# Patient Record
Sex: Male | Born: 1955 | Race: White | Hispanic: No | Marital: Married | Smoking: Never smoker
Health system: Southern US, Community
[De-identification: ages and names within clinical notes are randomized; demographics above are authoritative.]

## PROBLEM LIST (undated history)

## (undated) DIAGNOSIS — K219 Gastro-esophageal reflux disease without esophagitis: Secondary | ICD-10-CM

## (undated) HISTORY — DX: Gastro-esophageal reflux disease without esophagitis: K21.9

---

## 1998-11-10 HISTORY — PX: BACK SURGERY: SHX140

## 1999-02-18 ENCOUNTER — Encounter: Admission: RE | Admit: 1999-02-18 | Discharge: 1999-04-15 | Payer: Self-pay | Admitting: Family Medicine

## 1999-06-05 ENCOUNTER — Encounter: Payer: Self-pay | Admitting: Neurosurgery

## 1999-06-05 ENCOUNTER — Ambulatory Visit (HOSPITAL_COMMUNITY): Admission: RE | Admit: 1999-06-05 | Discharge: 1999-06-05 | Payer: Self-pay | Admitting: Neurosurgery

## 2005-10-20 ENCOUNTER — Encounter: Admission: RE | Admit: 2005-10-20 | Discharge: 2005-10-20 | Payer: Self-pay | Admitting: General Surgery

## 2005-10-23 ENCOUNTER — Ambulatory Visit (HOSPITAL_BASED_OUTPATIENT_CLINIC_OR_DEPARTMENT_OTHER): Admission: RE | Admit: 2005-10-23 | Discharge: 2005-10-23 | Payer: Self-pay | Admitting: General Surgery

## 2005-10-23 ENCOUNTER — Ambulatory Visit (HOSPITAL_COMMUNITY): Admission: RE | Admit: 2005-10-23 | Discharge: 2005-10-23 | Payer: Self-pay | Admitting: General Surgery

## 2009-02-26 ENCOUNTER — Ambulatory Visit: Payer: Self-pay | Admitting: Internal Medicine

## 2009-03-13 ENCOUNTER — Ambulatory Visit: Payer: Self-pay | Admitting: Internal Medicine

## 2009-03-15 ENCOUNTER — Telehealth (INDEPENDENT_AMBULATORY_CARE_PROVIDER_SITE_OTHER): Payer: Self-pay | Admitting: *Deleted

## 2009-09-07 ENCOUNTER — Telehealth: Payer: Self-pay | Admitting: Internal Medicine

## 2009-09-18 ENCOUNTER — Telehealth (INDEPENDENT_AMBULATORY_CARE_PROVIDER_SITE_OTHER): Payer: Self-pay | Admitting: *Deleted

## 2009-09-18 DIAGNOSIS — Q438 Other specified congenital malformations of intestine: Secondary | ICD-10-CM

## 2009-09-21 ENCOUNTER — Ambulatory Visit (HOSPITAL_COMMUNITY): Admission: RE | Admit: 2009-09-21 | Discharge: 2009-09-21 | Payer: Self-pay | Admitting: Internal Medicine

## 2009-09-26 ENCOUNTER — Telehealth: Payer: Self-pay | Admitting: Internal Medicine

## 2011-03-28 NOTE — Op Note (Signed)
NAMELACHARLES, ALTSCHULER            ACCOUNT NO.:  [id number]   MEDICAL RECORD NO.:  [id number]          PATIENT TYPE:  AMB   LOCATION:  DSC                          FACILITY:  [facility]   PHYSICIAN:  Leonie Man, M.D.   DATE OF BIRTH:  09/11/1956   DATE OF PROCEDURE:  10/23/2005  DATE OF DISCHARGE:  10/23/2005                                 OPERATIVE REPORT   PREOPERATIVE DIAGNOSIS:  Incarcerated ventral hernia.   POSTOPERATIVE DIAGNOSIS:  Incarcerated ventral hernia.   PROCEDURE:  Repair of incarcerated ventral hernia.   SURGEON:  Leonie Man, M.D.   ASSISTANT:  OR tech.   ANESTHESIA:  General.   FINDINGS:  There was a large hernia sac removed from the site which was  discarded.   ESTIMATED BLOOD LOSS:  Minimal.   COMPLICATIONS:  There no complications.   INDICATIONS FOR PROCEDURE:  The patient is a 55 year old man with a large  orange-size epigastric hernia which sometimes he says swells to the size of  a grapefruit but never completely goes away. This has been causing some mild  to moderate discomfort.  The patient comes to the operating room now for  repair of this hernia which has moderately incarcerated but has not been  causing any bowel obstructive symptoms.   DESCRIPTION OF PROCEDURE:  Following induction of satisfactory general  anesthesia, the patient is positioned supinely and the abdomen is prepped  and draped to be included in a sterile operative field. Midline incision is  carried down over the mass, deepened through skin, subcutaneous tissue with  dissection carried down around the entire mass and dissecting away from the  surrounding soft tissues, carrying the dissection down to the fascia of the  abdominal wall. The fascia was scored so as to release the mass and the mass  which comprised primarily the falciform ligament was then serially divided  between clamps and secured with ties of 2-0 silk.  The resulting defect then  was cleared of all adhesions  from within the abdomen and then this was  primarily closed with interrupted #1 Novofil sutures.  We then placed the  overlay patch over the defect with a polypropylene mesh and then this was  sutured down on all sides with interrupted 2-0 Novofil sutures.  The repair  was noted to be intact. Sponge and instrument counts were verified.  Subcutaneous tissues were closed with a running 2-0 Vicryl suture.  The skin  was  closed with a running 4-0 Monocryl suture and then reinforced with Steri-  Strips.  Sterile dressings applied.  The anesthetic was reversed. The  patient removed from the operating room to the recovery room in stable  condition.  He tolerated the procedure well.      Leonie Man, M.D.  Electronically Signed     PB/MEDQ  D:  10/23/2005  T:  10/26/2005  Job:  [id number]   cc:   Thelma Barge P. [place]esto Charon, M.D.  Fax: [phone]

## 2011-06-02 ENCOUNTER — Emergency Department (INDEPENDENT_AMBULATORY_CARE_PROVIDER_SITE_OTHER): Payer: Managed Care, Other (non HMO)

## 2011-06-02 ENCOUNTER — Emergency Department (HOSPITAL_BASED_OUTPATIENT_CLINIC_OR_DEPARTMENT_OTHER)
Admission: EM | Admit: 2011-06-02 | Discharge: 2011-06-02 | Disposition: A | Discharge status: Home / Self Care | Payer: Managed Care, Other (non HMO) | Attending: Emergency Medicine | Admitting: Emergency Medicine

## 2011-06-02 ENCOUNTER — Encounter: Payer: Self-pay | Admitting: *Deleted

## 2011-06-02 DIAGNOSIS — S83419A Sprain of medial collateral ligament of unspecified knee, initial encounter: Secondary | ICD-10-CM | POA: Insufficient documentation

## 2011-06-02 DIAGNOSIS — F17200 Nicotine dependence, unspecified, uncomplicated: Secondary | ICD-10-CM | POA: Insufficient documentation

## 2011-06-02 DIAGNOSIS — X58XXXA Exposure to other specified factors, initial encounter: Secondary | ICD-10-CM | POA: Insufficient documentation

## 2011-06-02 DIAGNOSIS — M25469 Effusion, unspecified knee: Secondary | ICD-10-CM

## 2011-06-02 DIAGNOSIS — M25569 Pain in unspecified knee: Secondary | ICD-10-CM

## 2011-06-02 NOTE — ED Provider Notes (Signed)
History     Chief Complaint  Patient presents with  . Knee Pain   HPI Comments: Left knee pain for several days.  No known injury or trauma.  Worse with ambulation, bearing weight.  Better with rest.    The history is provided by the patient.    History reviewed. No pertinent past medical history.  History reviewed. No pertinent past surgical history.  No family history on file.  History  Substance Use Topics  . Smoking status: Current Everyday Smoker    Types: Cigarettes  . Smokeless tobacco: Not on file  . Alcohol Use: 1.8 oz/week    3 Cans of beer per week      Review of Systems  Constitutional: Negative for fever and chills.  Cardiovascular: Negative for chest pain and palpitations.  Musculoskeletal: Positive for arthralgias.       Knee pain as above    Physical Exam  BP 111/74  Pulse 78  Temp(Src) 97.8 F (36.6 C) (Oral)  Resp 16  Ht 5\' 9"  (1.753 m)  Wt 220 lb (99.791 kg)  BMI 32.49 kg/m2  SpO2 100%  Physical Exam  Constitutional: He appears well-developed and well-nourished. No distress.  HENT:  Head: Normocephalic and atraumatic.  Musculoskeletal:       The left knee appears grossly normal.  There is no effusion or crepitus.  Pain with range of motion, especially with extension.  Stable AP, laterally.  Negative a/p drawer test.  Skin: He is not diaphoretic.    ED Course  Procedures  MDM Xrays, exam okay,  Will dc with nsaids and rest.      Jonathan Parsons 06/02/11 1021

## 2011-06-02 NOTE — ED Notes (Signed)
Pt reports left knee pain since yesterday  Morning. Denies known injury. Took OTC Ibuprofen without relief. States pain worse today. No swelling noted. Pt states painful to straighten out knee. Has crutch that he borrowed from friend. Painful to place weight on knee. Rates pain 8/10.

## 2011-09-26 ENCOUNTER — Telehealth (HOSPITAL_BASED_OUTPATIENT_CLINIC_OR_DEPARTMENT_OTHER): Payer: Self-pay | Admitting: Emergency Medicine

## 2011-09-26 ENCOUNTER — Encounter (HOSPITAL_BASED_OUTPATIENT_CLINIC_OR_DEPARTMENT_OTHER): Payer: Self-pay

## 2011-09-26 NOTE — ED Notes (Signed)
Spoke with patient regarding history recorded in medical record.  Pt stated that he is not a current and has never been a smoker.

## 2011-09-29 ENCOUNTER — Encounter (HOSPITAL_BASED_OUTPATIENT_CLINIC_OR_DEPARTMENT_OTHER): Payer: Self-pay

## 2011-11-11 HISTORY — PX: UMBILICAL HERNIA REPAIR: SHX196

## 2012-10-07 IMAGING — CR DG KNEE COMPLETE 4+V*L*
4 series · 4 of 4 positions shown · non-contrast
Comparison: None.

CLINICAL DATA: Pain

LEFT KNEE - COMPLETE 4+ VIEW

[t knee ap left]
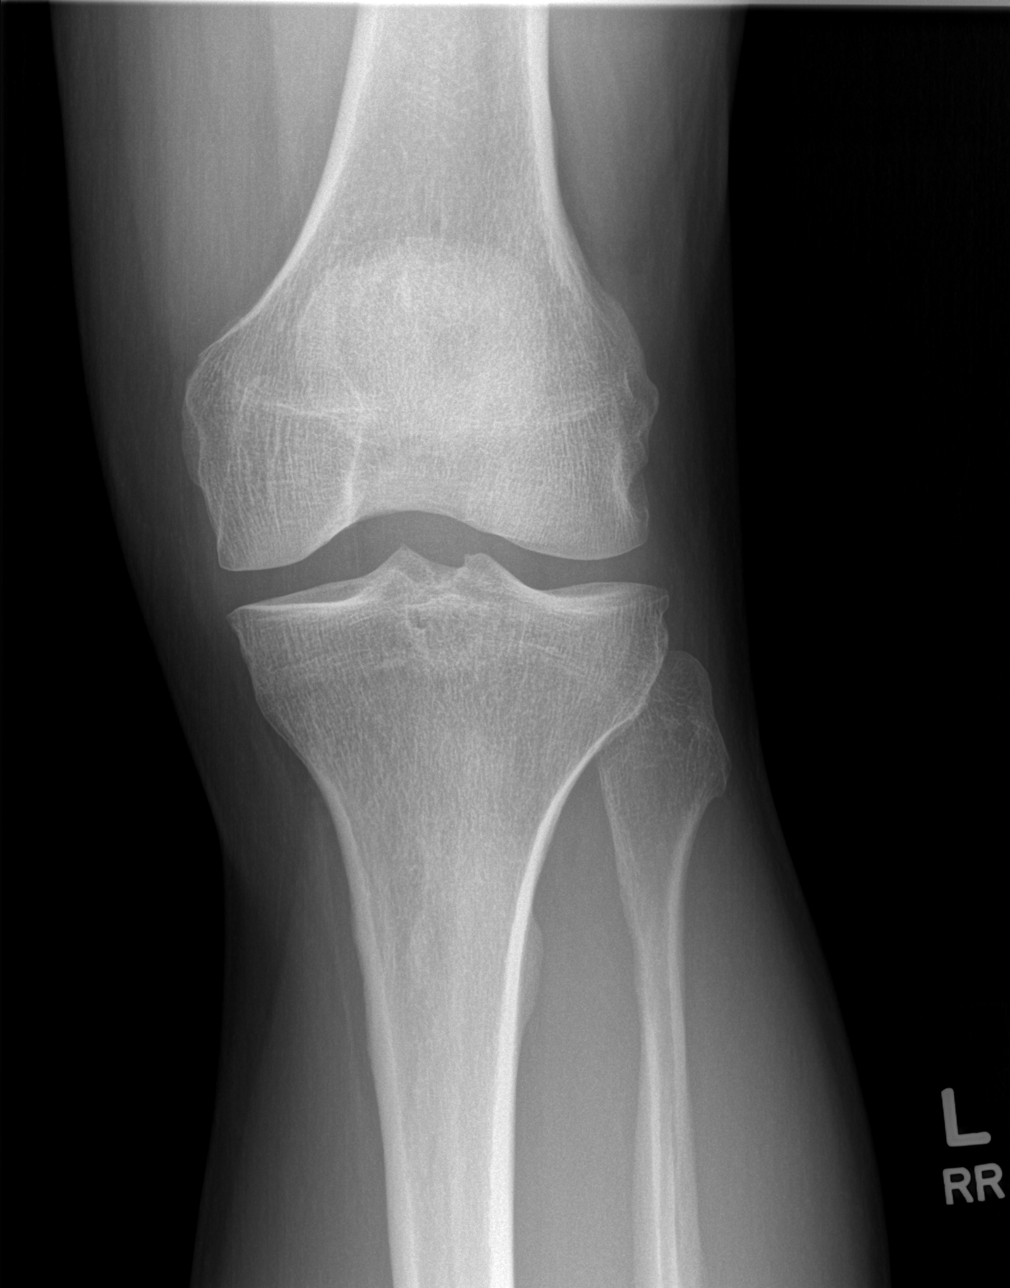

[t knee oblique left (1 of 2)]
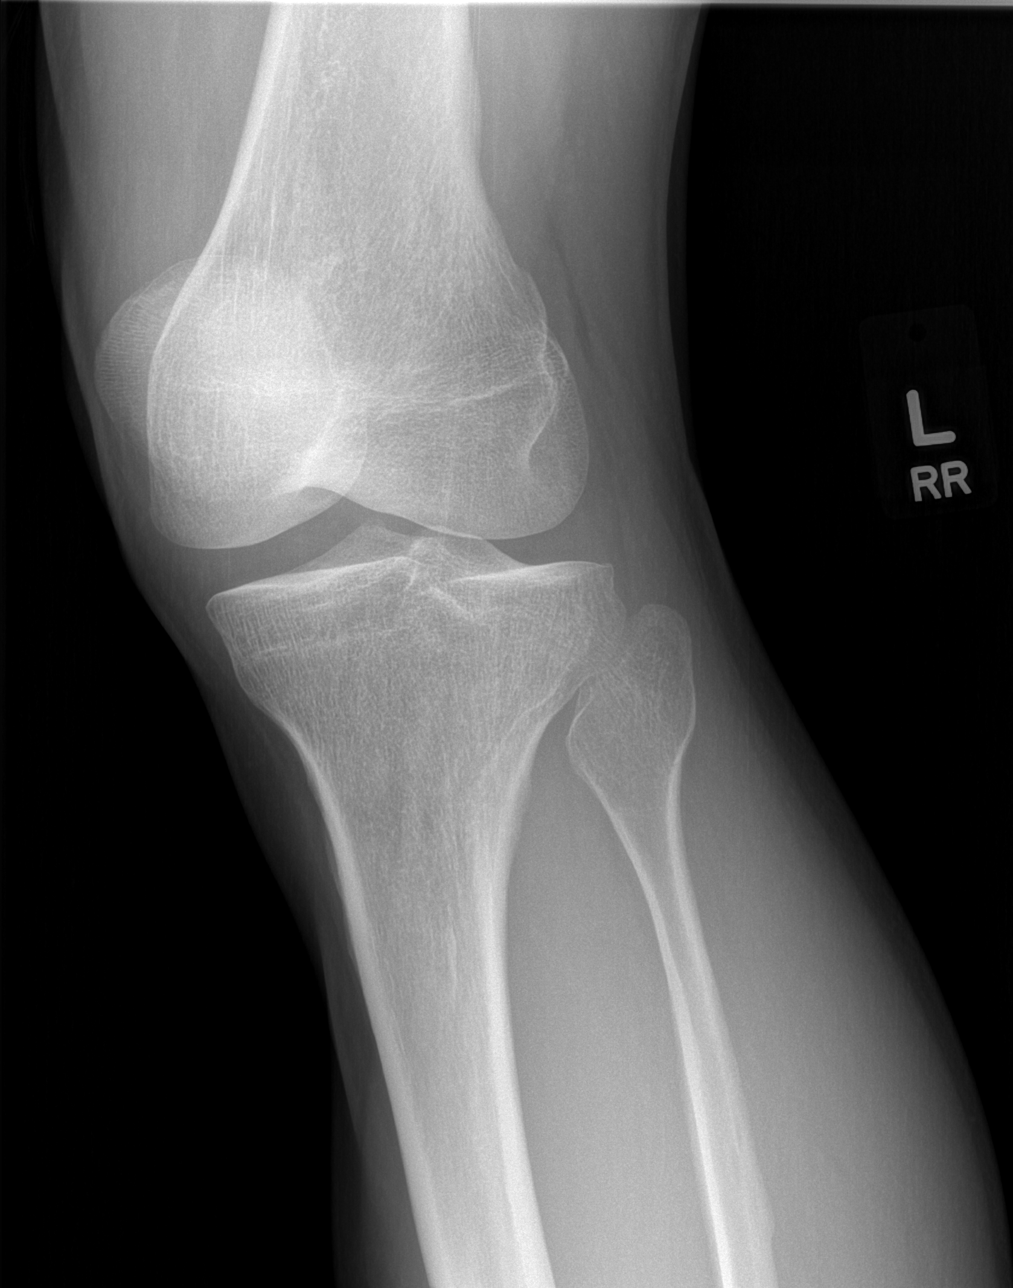

[t knee oblique left (2 of 2)]
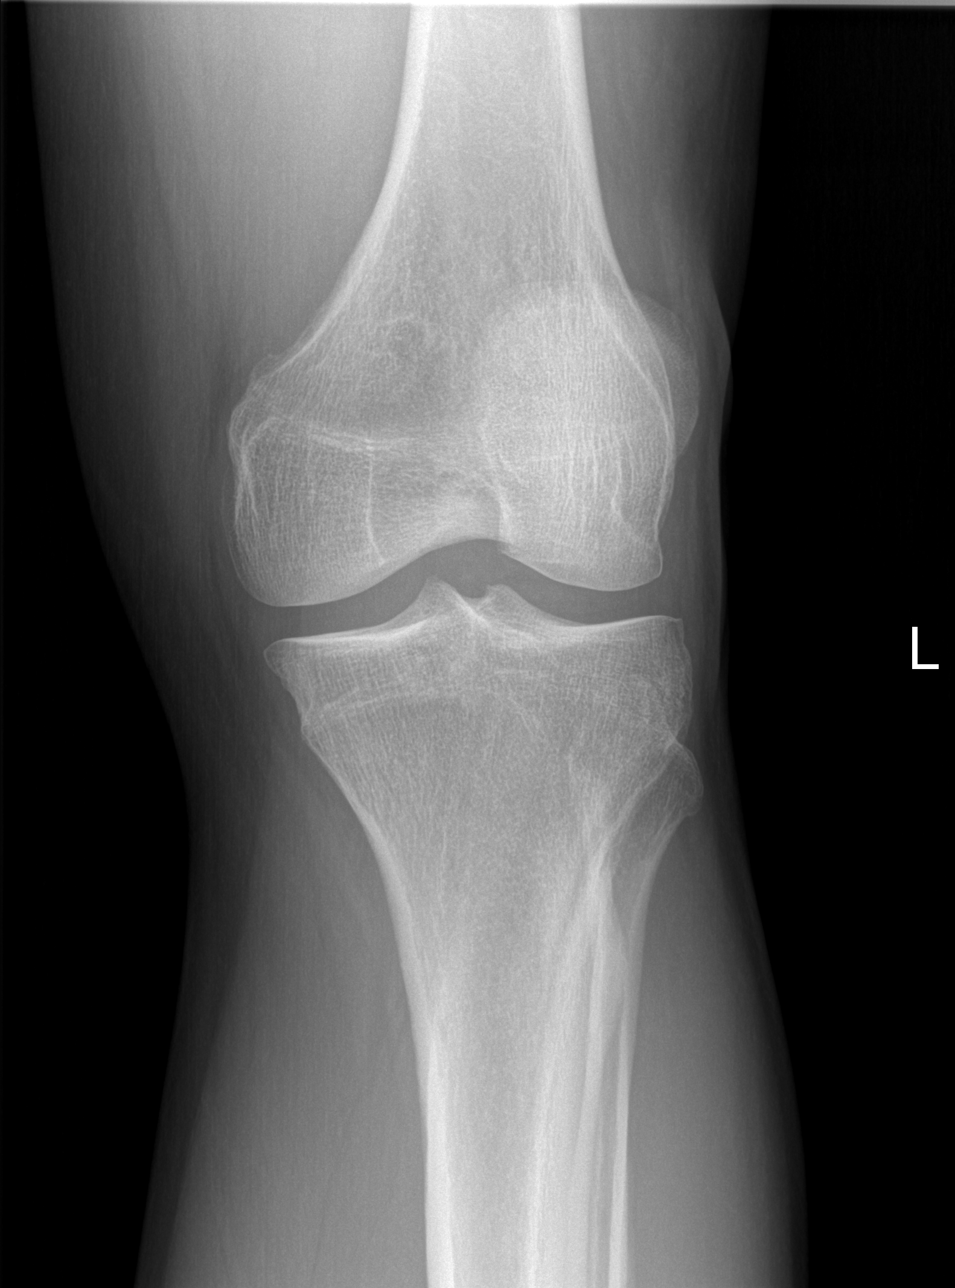

[t knee lat left]
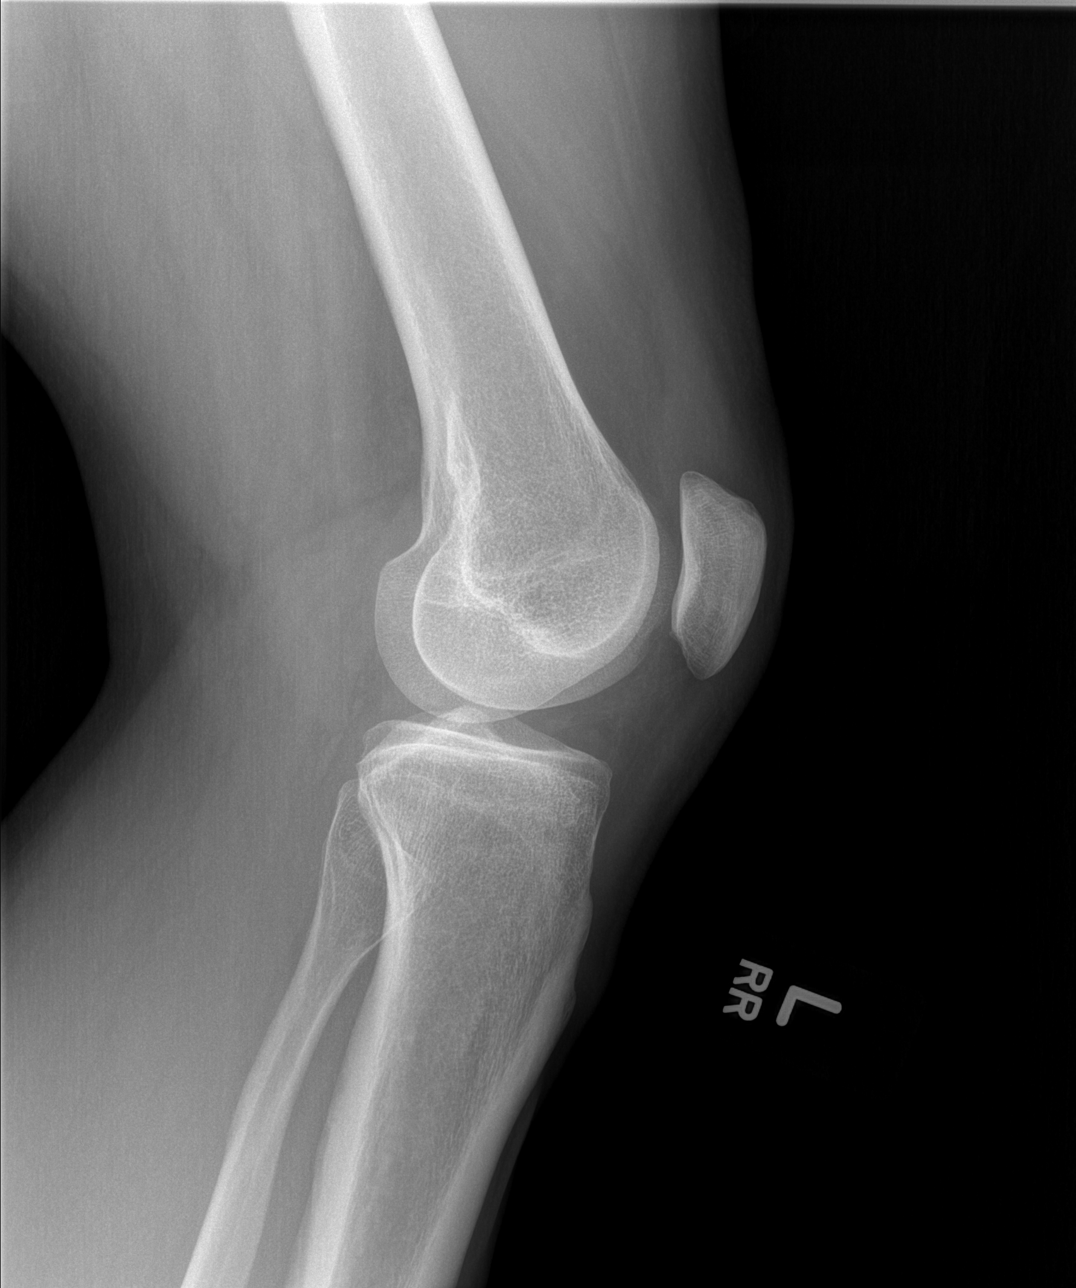

[4 of 4 positions shown; findings below may reference images not displayed]

FINDINGS: No acute fracture and no dislocation.  Small joint
effusion.
IMPRESSION: No acute bony injury.  Small joint effusion.

## 2015-09-10 ENCOUNTER — Encounter: Payer: Self-pay | Admitting: Internal Medicine

## 2016-08-27 ENCOUNTER — Encounter: Payer: Self-pay | Admitting: Internal Medicine

## 2019-04-25 ENCOUNTER — Encounter: Payer: Self-pay | Admitting: Internal Medicine

## 2020-12-21 ENCOUNTER — Telehealth: Payer: Self-pay | Admitting: Internal Medicine

## 2020-12-21 NOTE — Telephone Encounter (Signed)
Victorino Dike from [facility] called said the patient had a colonoscopy and needed to do something else due to scope and colon being extended so the patient is wondering if that is something he will always need to have done from here on out or are the scopes now more advance. Tel: [phone]

## 2020-12-21 NOTE — Telephone Encounter (Signed)
Discussed with Jonathan Parsons that per the colon report pt had a redundant colon. Scopes have advanced but that does not change his colon. Pt does not have insurance so he wanted to know what other options were. Pt received recall letter in June of 2020 to schedule another colon. If repeat colon was not successful provider would then decide what the next step would be. She is aware.

## 2022-03-25 ENCOUNTER — Ambulatory Visit (AMBULATORY_SURGERY_CENTER): Payer: Managed Care, Other (non HMO) | Admitting: *Deleted

## 2022-03-25 VITALS — Ht 70.0 in | Wt 230.0 lb

## 2022-03-25 DIAGNOSIS — Z1211 Encounter for screening for malignant neoplasm of colon: Secondary | ICD-10-CM

## 2022-03-25 MED ORDER — PLENVU 140 G PO SOLR: 1.0000 | ORAL | 0 refills | Status: DC

## 2022-03-25 NOTE — Progress Notes (Signed)
No egg or soy allergy known to patient  No issues known to pt with past sedation with any surgeries or procedures Patient denies ever being told they had issues or difficulty with intubation  No FH of Malignant Hyperthermia Pt is not on diet pills Pt is not on  home 02  Pt is not on blood thinners  Pt denies issues with constipation  No A fib or A flutter  Plenvu Coupon to pt in PV today , Code to Pharmacy and  NO PA's for preps discussed with pt In PV today  Discussed with pt there will be an out-of-pocket cost for prep and that varies from $0 to 70 +  dollars - pt verbalized understanding    PV completed over the phone. Pt verified name, DOB, address and insurance during PV today.   Pt encouraged to call with questions or issues.  If pt has My chart, procedure instructions sent via My Chart  Insurance confirmed with pt at PV today   

## 2022-03-31 ENCOUNTER — Encounter: Payer: Self-pay | Admitting: Internal Medicine

## 2022-04-03 ENCOUNTER — Encounter: Payer: Self-pay | Admitting: Internal Medicine

## 2022-04-03 ENCOUNTER — Ambulatory Visit (AMBULATORY_SURGERY_CENTER): Payer: Medicare Other | Admitting: Internal Medicine

## 2022-04-03 VITALS — BP 114/66 | HR 57 | Temp 98.0°F | Resp 14 | Ht 69.0 in | Wt 230.0 lb

## 2022-04-03 DIAGNOSIS — Z538 Procedure and treatment not carried out for other reasons: Secondary | ICD-10-CM | POA: Diagnosis not present

## 2022-04-03 DIAGNOSIS — Z1211 Encounter for screening for malignant neoplasm of colon: Secondary | ICD-10-CM | POA: Diagnosis present

## 2022-04-03 MED ORDER — SODIUM CHLORIDE 0.9 % IV SOLN: 500.0000 mL | Freq: Once | INTRAVENOUS | Status: DC

## 2022-04-03 NOTE — Progress Notes (Signed)
PT taken to PACU. Monitors in place. VSS. Report given to RN. 

## 2022-04-03 NOTE — Progress Notes (Signed)
Vitals-CW  Pt's states no medical or surgical changes since previsit or office visit. 

## 2022-04-03 NOTE — Patient Instructions (Signed)
Discharge instructions given. Incomplete exam. Office will schedule VIRTUAL COLONOSCOPY extremely redundant colon with incomplete  colonoscopy. Resume previous medications. YOU HAD AN ENDOSCOPIC PROCEDURE TODAY AT THE [facility]:   Refer to the procedure report that was given to you for any specific questions about what was found during the examination.  If the procedure report does not answer your questions, please call your gastroenterologist to clarify.  If you requested that your care partner not be given the details of your procedure findings, then the procedure report has been included in a sealed envelope for you to review at your convenience later.  YOU SHOULD EXPECT: Some feelings of bloating in the abdomen. Passage of more gas than usual.  Walking can help get rid of the air that was put into your GI tract during the procedure and reduce the bloating. If you had a lower endoscopy (such as a colonoscopy or flexible sigmoidoscopy) you may notice spotting of blood in your stool or on the toilet paper. If you underwent a bowel prep for your procedure, you may not have a normal bowel movement for a few days.  Please Note:  You might notice some irritation and congestion in your nose or some drainage.  This is from the oxygen used during your procedure.  There is no need for concern and it should clear up in a day or so.  SYMPTOMS TO REPORT IMMEDIATELY:  Following lower endoscopy (colonoscopy or flexible sigmoidoscopy):  Excessive amounts of blood in the stool  Significant tenderness or worsening of abdominal pains  Swelling of the abdomen that is new, acute  Fever of 100F or higher   For urgent or emergent issues, a gastroenterologist can be reached at any hour by calling [phone]. Do not use MyChart messaging for urgent concerns.    DIET:  We do recommend a small meal at first, but then you may proceed to your regular diet.  Drink plenty of fluids but you should  avoid alcoholic beverages for 24 hours.  ACTIVITY:  You should plan to take it easy for the rest of today and you should NOT DRIVE or use heavy machinery until tomorrow (because of the sedation medicines used during the test).    FOLLOW UP: Our staff will call the number listed on your records 48-72 hours following your procedure to check on you and address any questions or concerns that you may have regarding the information given to you following your procedure. If we do not reach you, we will leave a message.  We will attempt to reach you two times.  During this call, we will ask if you have developed any symptoms of COVID 19. If you develop any symptoms (ie: fever, flu-like symptoms, shortness of breath, cough etc.) before then, please call [phone].  If you test positive for Covid 19 in the 2 weeks post procedure, please call and report this information to [place].    If any biopsies were taken you will be contacted by phone or by letter within the next 1-3 weeks.  Please call us at [phone] if you have not heard about the biopsies in 3 weeks.    SIGNATURES/CONFIDENTIALITY: You and/or your care partner have signed paperwork which will be entered into your electronic medical record.  These signatures attest to the fact that that the information above on your After Visit Summary has been reviewed and is understood.  Full responsibility of the confidentiality of this discharge information lies with you and/or your  care-partner.

## 2022-04-03 NOTE — Progress Notes (Signed)
HISTORY OF PRESENT ILLNESS:  Jonathan Parsons is a 66 y.o. male who presents today for screening colonoscopy.  Previous examination May 2010 was incomplete due to redundant colon.  Subsequent barium enema demonstrated a very redundant colon but no other abnormalities.  No active complaints  REVIEW OF SYSTEMS:  All non-GI ROS negative. Past Medical History:  Diagnosis Date   GERD (gastroesophageal reflux disease)     Past Surgical History:  Procedure Laterality Date   BACK SURGERY  2000   other surgery in 47s   UMBILICAL HERNIA REPAIR  2013    Social History Jonathan Parsons  reports that he has never smoked. He has never used smokeless tobacco. He reports current alcohol use of about 3.0 standard drinks per week. He reports that he does not use drugs.  family history includes Colon polyps in his mother.  Allergies  Allergen Reactions   Penicillins     REACTION: Unspecified       PHYSICAL EXAMINATION:  Vital signs: BP 130/75   Pulse 70   Temp 98 F (36.7 C)   Ht 5\' 9"  (1.753 m)   Wt 230 lb (104.3 kg)   SpO2 97%   BMI 33.97 kg/m  General: Well-developed, well-nourished, no acute distress HEENT: Sclerae are anicteric, conjunctiva pink. Oral mucosa intact Lungs: Clear Heart: Regular Abdomen: soft, nontender, nondistended, no obvious ascites, no peritoneal signs, normal bowel sounds. No organomegaly. Extremities: No edema Psychiatric: alert and oriented x3. Cooperative     ASSESSMENT:  Colon cancer screening History of redundant colon with a complete exam   PLAN:    Screening colonoscopy

## 2022-04-03 NOTE — Op Note (Signed)
[facility] Patient Name: Jonathan Parsons Procedure Date: 04/03/2022 10:08 AMIdelle Leech MRN: [id number] Endoscopist: Wilhemina BonitoJohn N. Marina GoodellPerry , MD Age: 66 Referring MD:  Date of Birth: [birth year] Gender: Male Account #: [id number] Procedure:                Colonoscopy Indications:              Screening for colorectal malignant neoplasm.                            Previous examination May 2010 demonstrated                            redundant colon. Incomplete exam. Follow-up barium                            enema negative. Medicines:                Monitored Anesthesia Care Procedure:                Pre-Anesthesia Assessment:                           - Prior to the procedure, a History and Physical                            was performed, and patient medications and                            allergies were reviewed. The patient's tolerance of                            previous anesthesia was also reviewed. The risks                            and benefits of the procedure and the sedation                            options and risks were discussed with the patient.                            All questions were answered, and informed consent                            was obtained. Prior Anticoagulants: The patient has                            taken no previous anticoagulant or antiplatelet                            agents. ASA Grade Assessment: II - A patient with                            mild systemic disease. After reviewing the risks  and benefits, the patient was deemed in                            satisfactory condition to undergo the procedure.                           After obtaining informed consent, the colonoscope                            was passed under direct vision. Throughout the                            procedure, the patient's blood pressure, pulse, and                            oxygen saturations were monitored continuously. The                             CF [id number] #[id number] was introduced through the anus                            and advanced to the the proximal ascending colon.                            The colon and rectum were photographed. The quality                            of the bowel preparation was excellent. The                            colonoscopy was performed without difficulty. The                            patient tolerated the procedure well. The bowel                            preparation used was SUPREP via split dose                            instruction. Scope In: Scope Out: Findings:                 The colon was extremely redundant. Despite variable                            stiffness adult scope, under water colonoscopy,                            various body positions, and counterpressure                            examination could only be completed to the right                            colon. Exam was  otherwise without abnormality on                            direct and retroflexion views. No polyps or other                            lesions. Complications:            No immediate complications. Estimated blood loss:                            None. Estimated Blood Loss:     Estimated blood loss: none. Impression:               1. Normal colon to the right colon.                           2. Extremely redundant colon with incomplete exam. Recommendation:           1. Schedule VIRTUAL COLONOSCOPY "extremely                            redundant colon with incomplete colonoscopy, clear                            proximal colon".                           2. Patient has a contact number available for                            emergencies. The signs and symptoms of potential                            delayed complications were discussed with the                            patient. Return to normal activities tomorrow.                            Written discharge instructions  were provided to the                            patient.                           3. Resume previous diet.                           4. Continue present medications. Wilhemina Bonito. Marina Goodell, MD 04/03/2022 10:50:10 AM This report has been signed electronically.

## 2022-04-04 ENCOUNTER — Telehealth: Payer: Self-pay

## 2022-04-04 NOTE — Telephone Encounter (Signed)
  Follow up Call-     04/03/2022    9:07 AM 04/03/2022    8:57 AM  Call back number  Post procedure Call Back phone  # [phone]   Permission to leave phone message Yes Yes     Patient questions:  Do you have a fever, pain , or abdominal swelling? No. Pain Score  0 *  Have you tolerated food without any problems? Yes.    Have you been able to return to your normal activities? Yes.    Do you have any questions about your discharge instructions: Diet   No. Medications  No. Follow up visit  No.  Do you have questions or concerns about your Care? No.  Actions: * If pain score is 4 or above: No action needed, pain <4.

## 2022-04-09 ENCOUNTER — Other Ambulatory Visit: Payer: Self-pay

## 2022-04-09 DIAGNOSIS — Q438 Other specified congenital malformations of intestine: Secondary | ICD-10-CM

## 2022-05-22 ENCOUNTER — Ambulatory Visit
Admission: RE | Admit: 2022-05-22 | Discharge: 2022-05-22 | Disposition: A | Discharge status: Home / Self Care | Payer: Medicare Other | Source: Ambulatory Visit | Attending: Internal Medicine | Admitting: Internal Medicine

## 2022-05-22 DIAGNOSIS — Q438 Other specified congenital malformations of intestine: Secondary | ICD-10-CM
